# Patient Record
Sex: Female | Born: 1965 | Race: Black or African American | Hispanic: No | Marital: Married | Smoking: Never smoker
Health system: Southern US, Community
[De-identification: ages and names within clinical notes are randomized; demographics above are authoritative.]

## PROBLEM LIST (undated history)

## (undated) HISTORY — PX: VAGINAL HYSTERECTOMY: SHX2639

## (undated) HISTORY — PX: BILATERAL SALPINGOOPHORECTOMY: SHX1223

---

## 2003-10-07 ENCOUNTER — Emergency Department (HOSPITAL_COMMUNITY): Admission: EM | Admit: 2003-10-07 | Discharge: 2003-10-07 | Payer: Self-pay | Admitting: Emergency Medicine

## 2004-01-27 ENCOUNTER — Emergency Department: Payer: Self-pay | Admitting: Emergency Medicine

## 2004-06-25 ENCOUNTER — Emergency Department: Payer: Self-pay | Admitting: Emergency Medicine

## 2004-12-12 ENCOUNTER — Emergency Department: Payer: Self-pay | Admitting: Emergency Medicine

## 2004-12-12 ENCOUNTER — Ambulatory Visit: Payer: Self-pay | Admitting: Emergency Medicine

## 2005-04-30 ENCOUNTER — Emergency Department: Payer: Self-pay | Admitting: Emergency Medicine

## 2005-05-02 ENCOUNTER — Ambulatory Visit: Payer: Self-pay | Admitting: Gynecology

## 2005-05-02 ENCOUNTER — Inpatient Hospital Stay (HOSPITAL_COMMUNITY): Admission: AD | Admit: 2005-05-02 | Discharge: 2005-05-02 | Payer: Self-pay | Admitting: Gynecology

## 2005-05-25 ENCOUNTER — Ambulatory Visit (HOSPITAL_COMMUNITY): Admission: RE | Admit: 2005-05-25 | Discharge: 2005-05-25 | Payer: Self-pay | Admitting: Obstetrics & Gynecology

## 2005-07-09 ENCOUNTER — Encounter (INDEPENDENT_AMBULATORY_CARE_PROVIDER_SITE_OTHER): Payer: Self-pay | Admitting: Specialist

## 2005-07-09 ENCOUNTER — Ambulatory Visit: Payer: Self-pay | Admitting: Gynecology

## 2005-07-09 ENCOUNTER — Ambulatory Visit (HOSPITAL_COMMUNITY): Admission: RE | Admit: 2005-07-09 | Discharge: 2005-07-09 | Payer: Self-pay | Admitting: Gynecology

## 2005-08-06 ENCOUNTER — Ambulatory Visit: Payer: Self-pay | Admitting: Gynecology

## 2005-09-14 ENCOUNTER — Ambulatory Visit: Payer: Self-pay | Admitting: Gynecology

## 2005-10-24 ENCOUNTER — Ambulatory Visit: Payer: Self-pay | Admitting: Gynecology

## 2005-10-26 ENCOUNTER — Ambulatory Visit: Payer: Self-pay | Admitting: Gynecology

## 2006-02-21 ENCOUNTER — Ambulatory Visit: Payer: Self-pay | Admitting: Obstetrics & Gynecology

## 2006-03-01 ENCOUNTER — Ambulatory Visit: Payer: Self-pay | Admitting: Gynecology

## 2006-04-12 ENCOUNTER — Emergency Department: Payer: Self-pay | Admitting: Emergency Medicine

## 2006-04-16 ENCOUNTER — Ambulatory Visit: Payer: Self-pay | Admitting: Family Medicine

## 2006-05-29 ENCOUNTER — Ambulatory Visit: Payer: Self-pay | Admitting: Gynecology

## 2007-11-09 IMAGING — CT CT STONE STUDY
1 of 2 series · 16 of 32 positions shown, 20 images · non-contrast
Comparison: none

REASON FOR EXAM: RIGHT SIDED PAIN  RM 10
COMMENTS:

[Series 2: stone · axial · 0.64mm/px · z∈[-542,-152]mm · 16 of 145 slices shown, 20 images]
[im 10/145  soft-tissue]
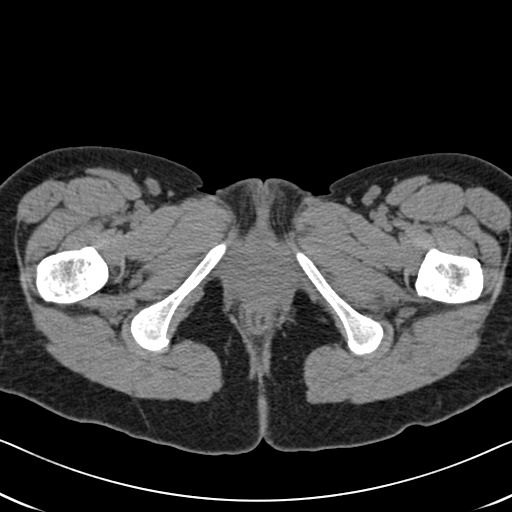
[im 10/145  bone]
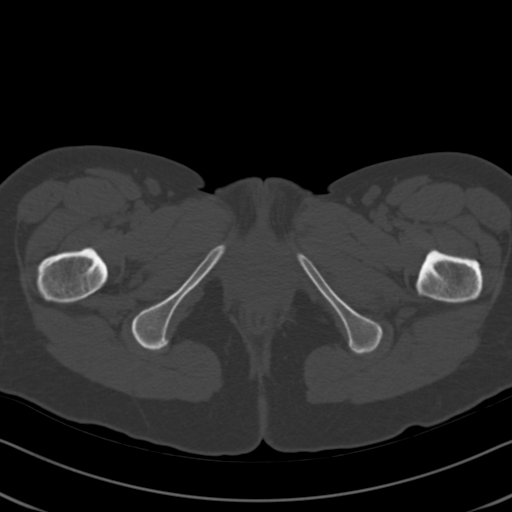
[im 20/145  soft-tissue]
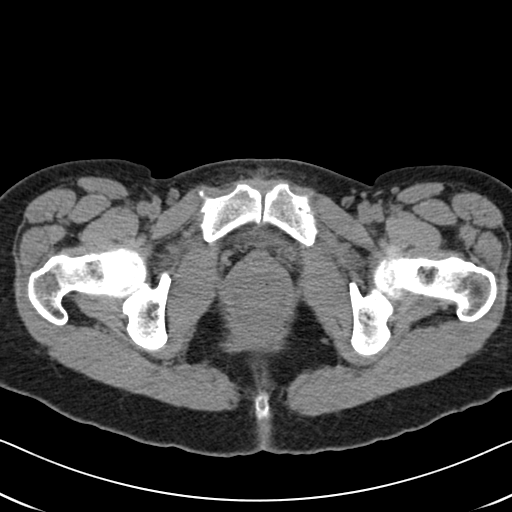
[im 30/145  soft-tissue]
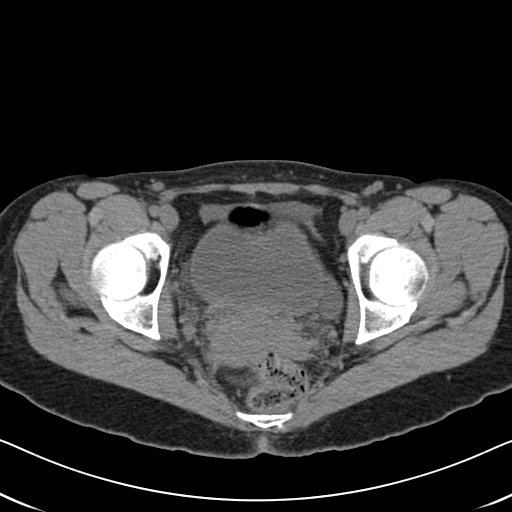
[im 40/145  soft-tissue]
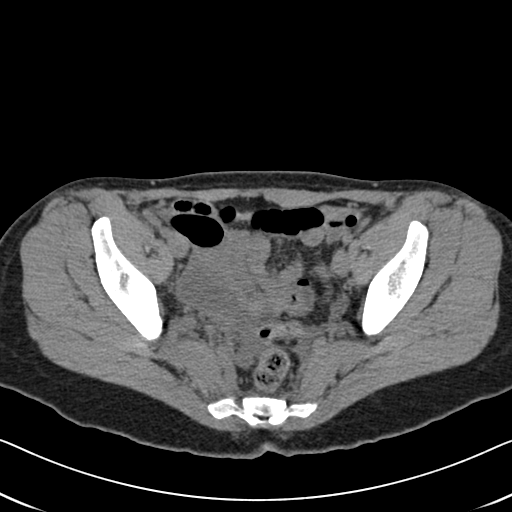
[im 50/145  soft-tissue]
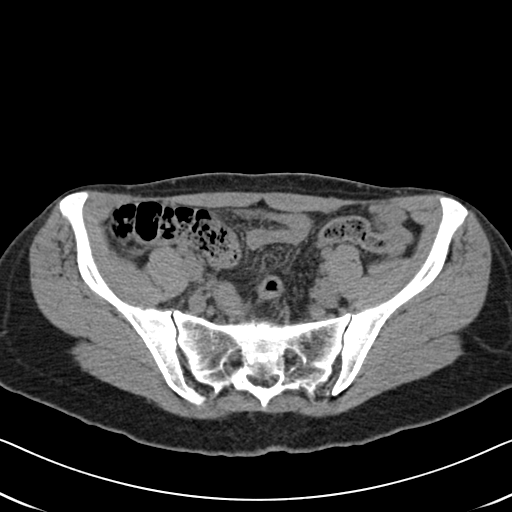
[im 60/145  soft-tissue]
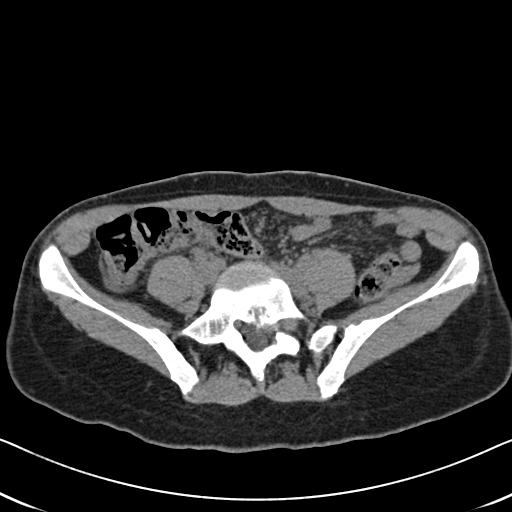
[im 70/145  soft-tissue]
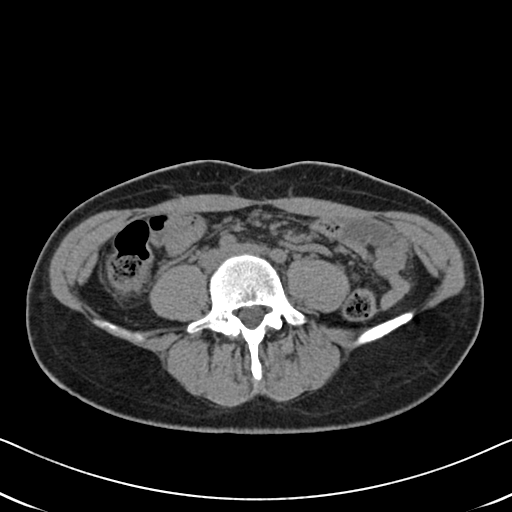
[im 80/145  soft-tissue]
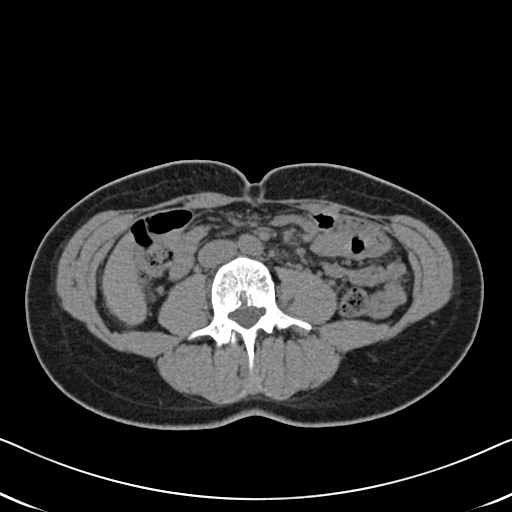
[im 90/145  soft-tissue]
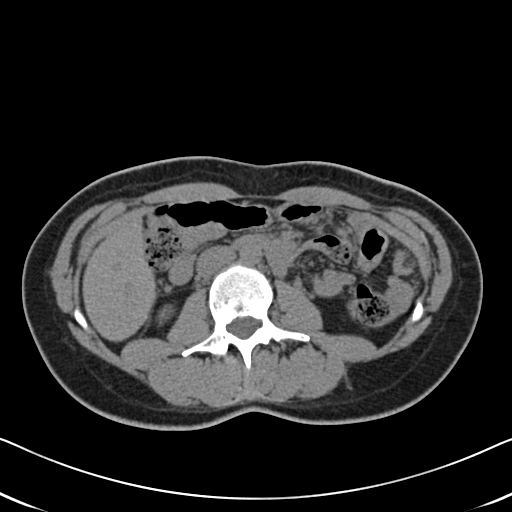
[im 90/145  bone]
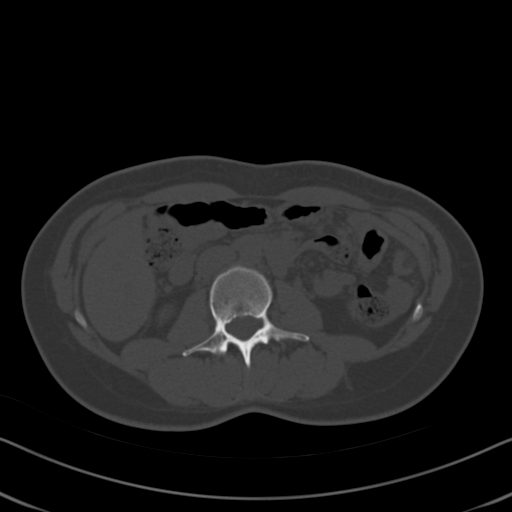
[im 100/145  soft-tissue]
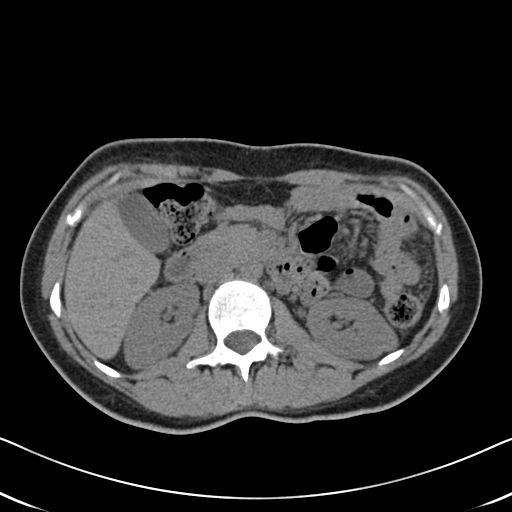
[im 110/145  soft-tissue]
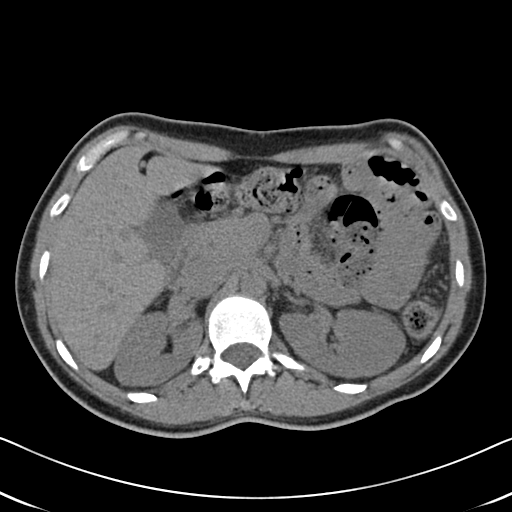
[im 120/145  soft-tissue]
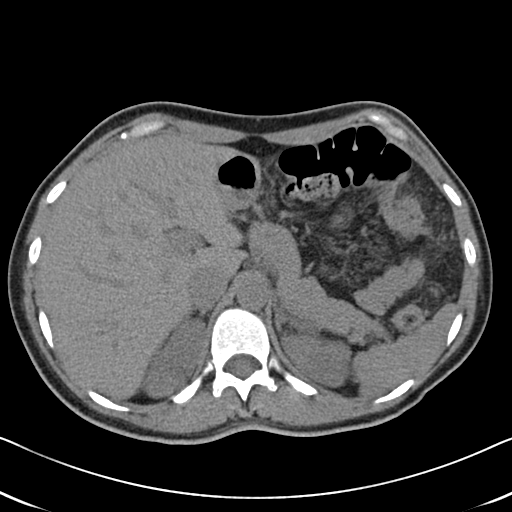
[im 125/145  lung]
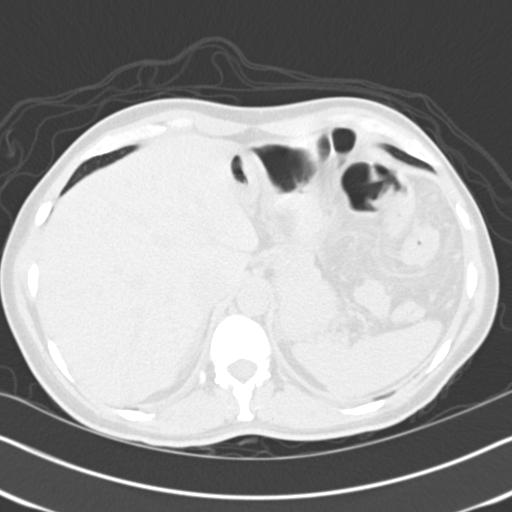
[im 130/145  soft-tissue]
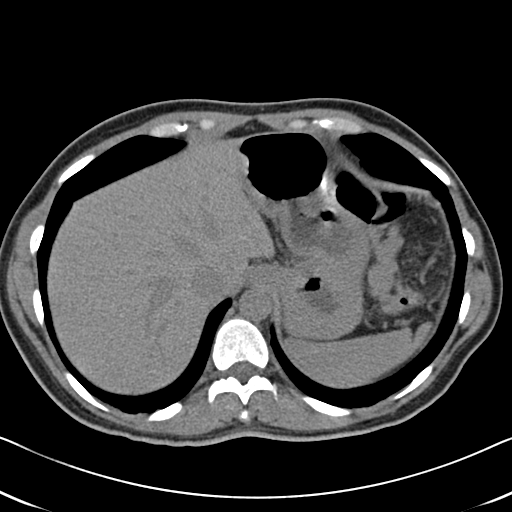
[im 130/145  lung]
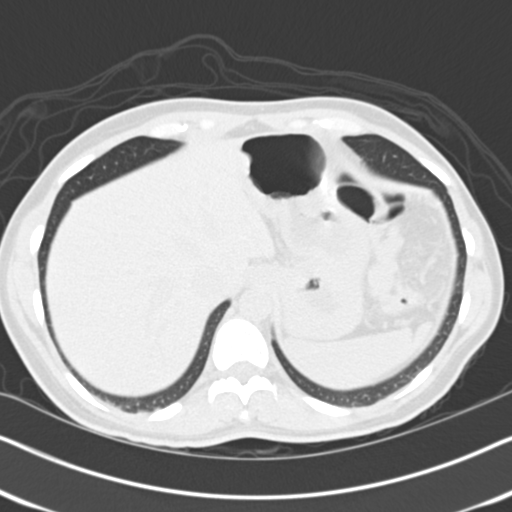
[im 135/145  lung]
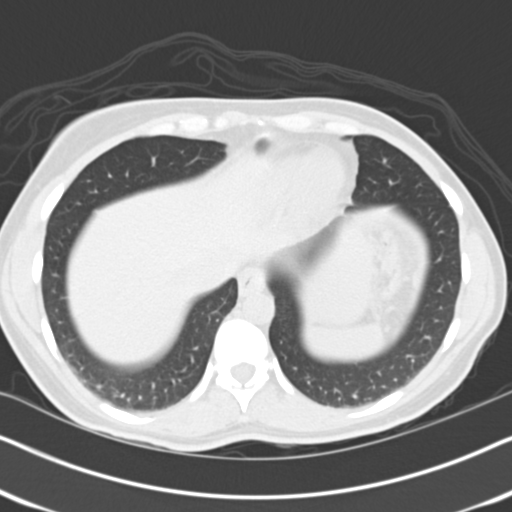
[im 140/145  soft-tissue]
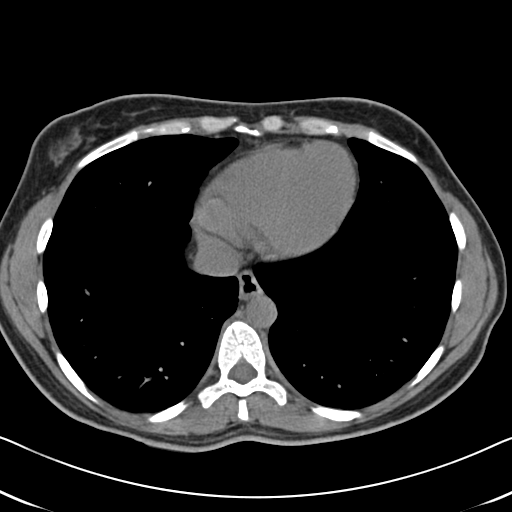
[im 140/145  lung]
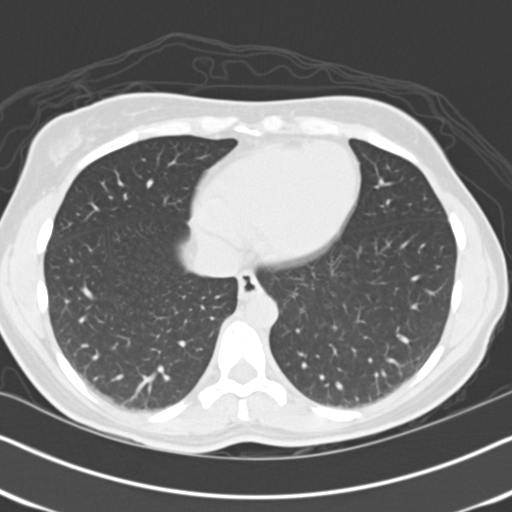

[16 of 32 positions shown; findings below may reference images not displayed]

PROCEDURE:     CT  - CT ABDOMEN /PELVIS WO (STONE)  - April 30, 2005  [DATE]

RESULT:

REASON FOR CONSULTATION: An emergent unenhanced CT scan was performed for
RIGHT sided flank pain.

No renal calculi are identified. No hydronephrosis. No ureteral calculi are
seen.

There is noted fluid in the cul-de-sac region. I cannot specifically
distinguish the ovaries.  There is a possible ovarian cyst on the RIGHT.  No
masses are noted. No definitive abnormality is seen in the major organs.
The images through the lung bases reveal the lung bases to be clear with no
effusions present.
IMPRESSION: 1)Fluid identified in the cul-de-sac.  Possible RIGHT ovarian cyst is
present although I am not certain.  This can be confirmed with ultrasound.
No renal or ureteral calculi are noted.

The report was called to the [HOSPITAL] the conclusion of the
dictation.

## 2007-11-09 IMAGING — US US PELV - US TRANSVAGINAL
1 series · 17 of 25 positions shown · non-contrast
Comparison: none

REASON FOR EXAM: Pelvic pain, fluid in pelvic area  RM 10
COMMENTS:  LMP: Post Hysterectomy

[Series 1: us pelv - us transvaginal · 17 of 47 slices shown]
[im 1/47]
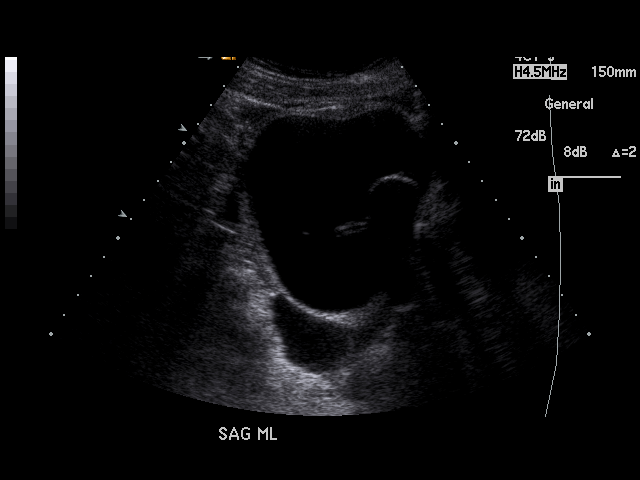
[im 4/47]
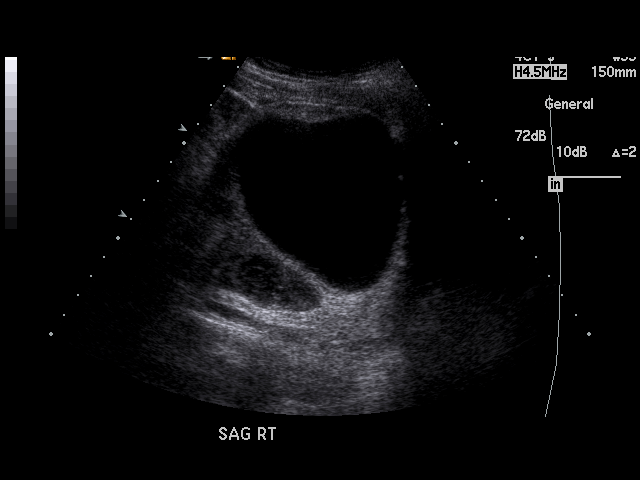
[im 6/47]
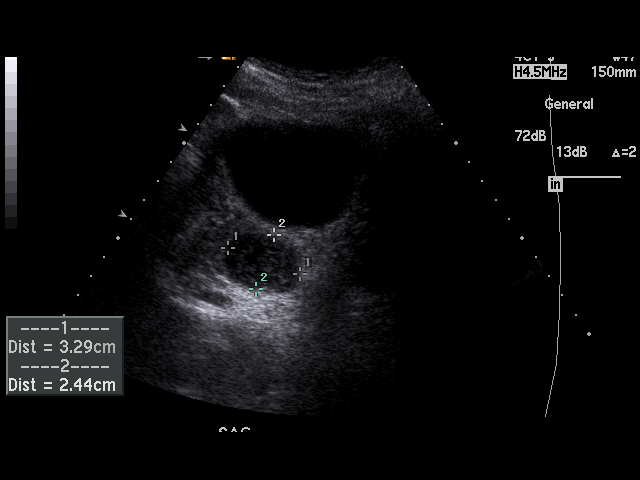
[im 10/47]
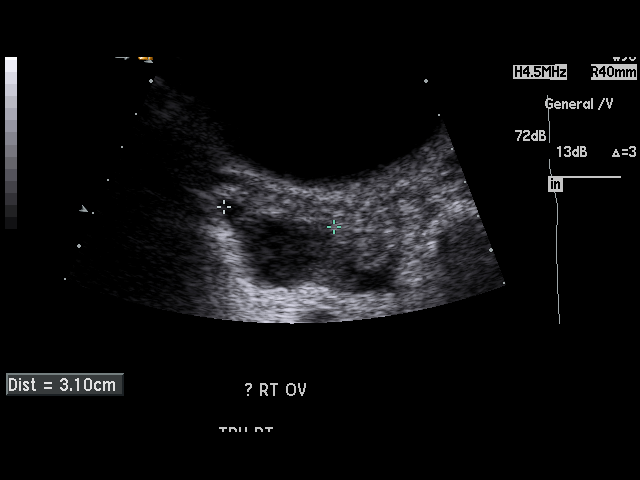
[im 12/47]
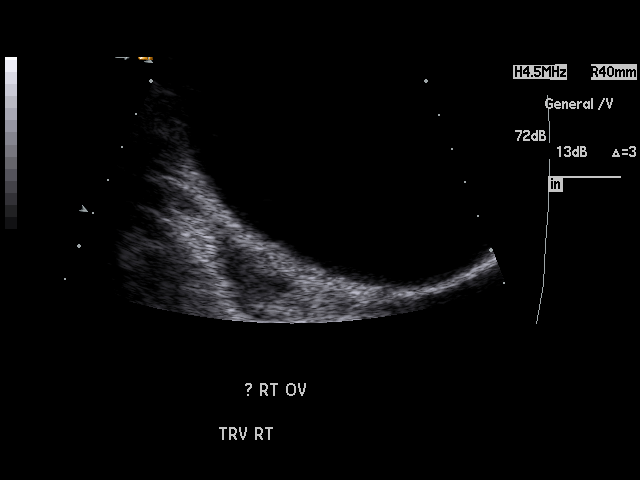
[im 16/47]
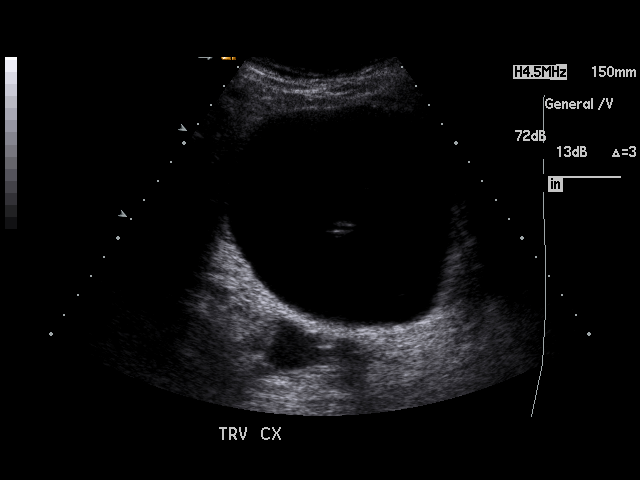
[im 18/47]
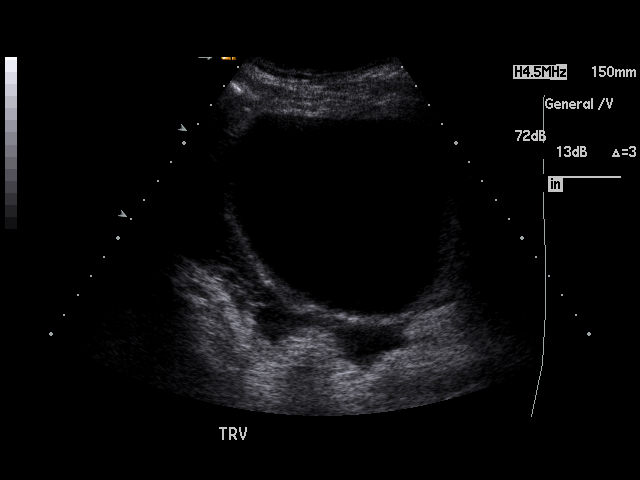
[im 22/47]
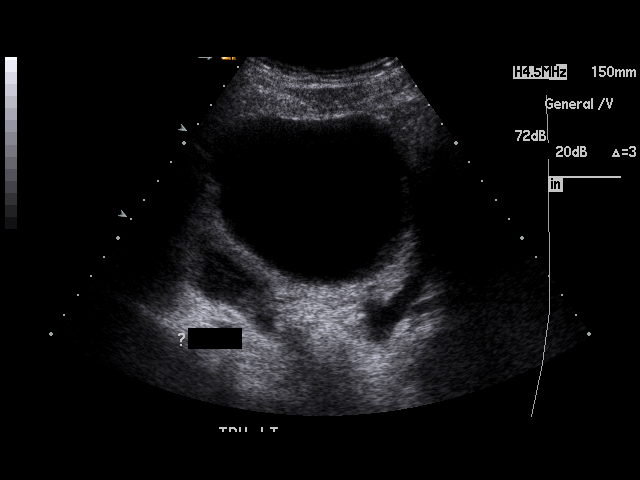
[im 24/47]
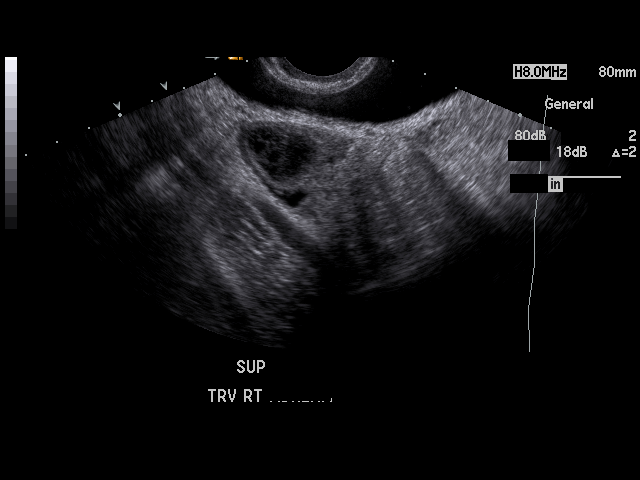
[im 25/47]
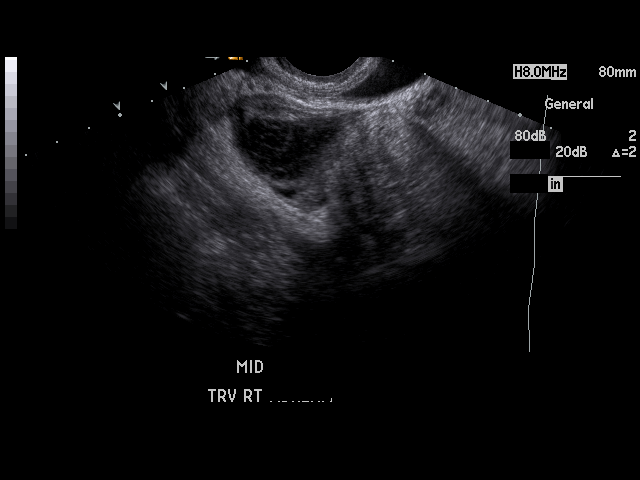
[im 29/47]
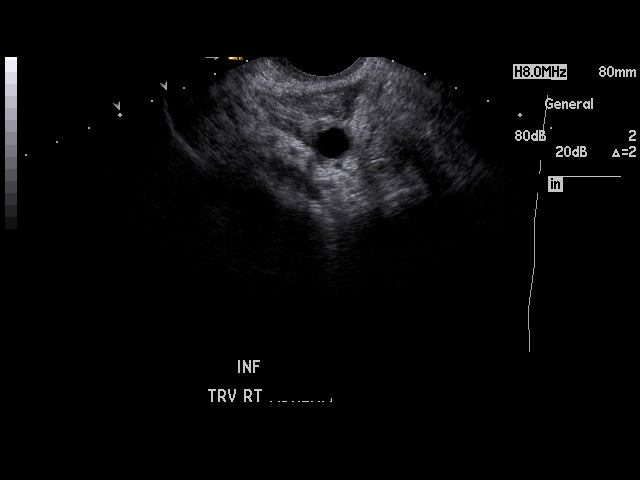
[im 31/47]
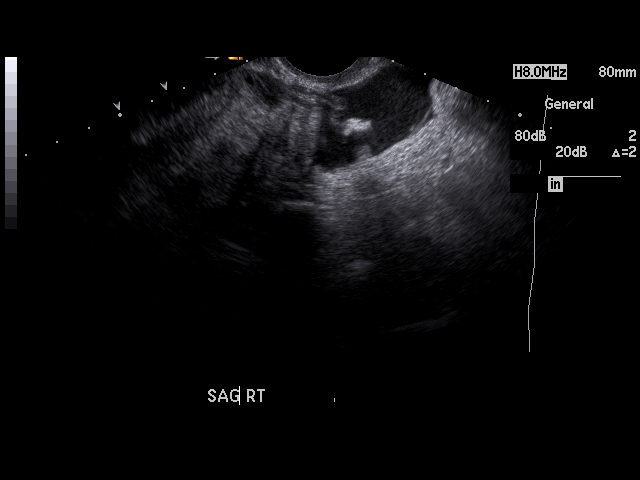
[im 35/47]
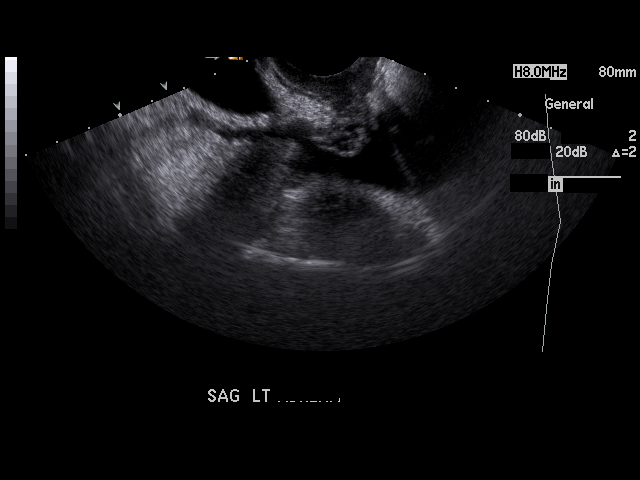
[im 37/47]
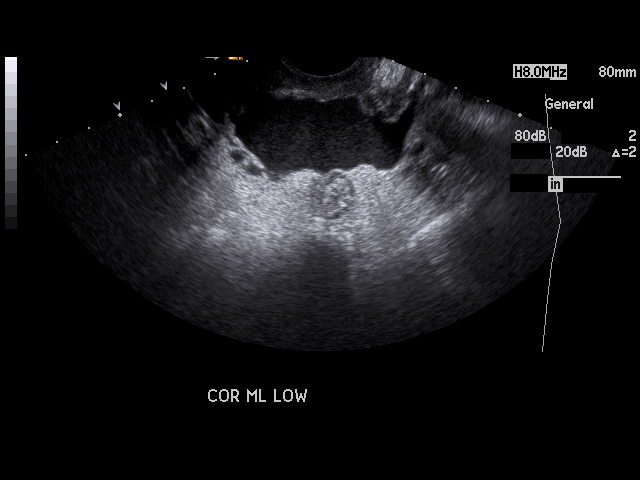
[im 41/47]
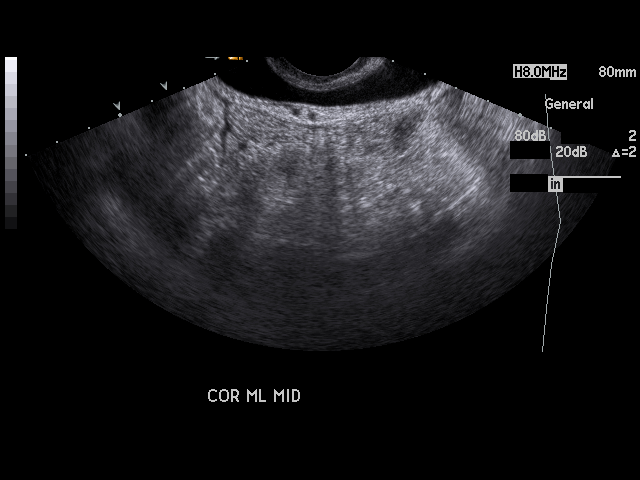
[im 43/47]
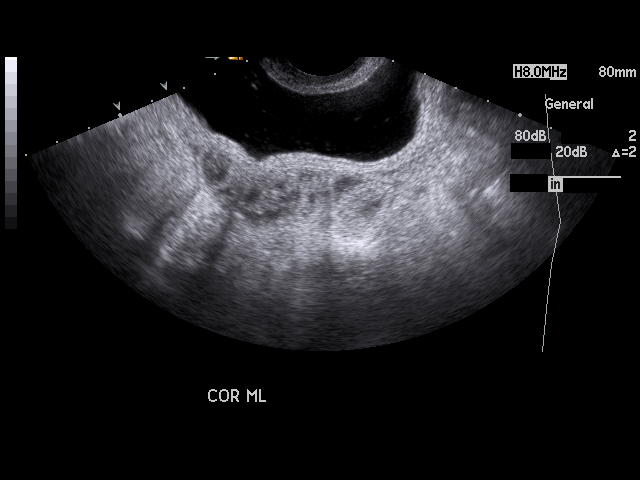
[im 47/47]
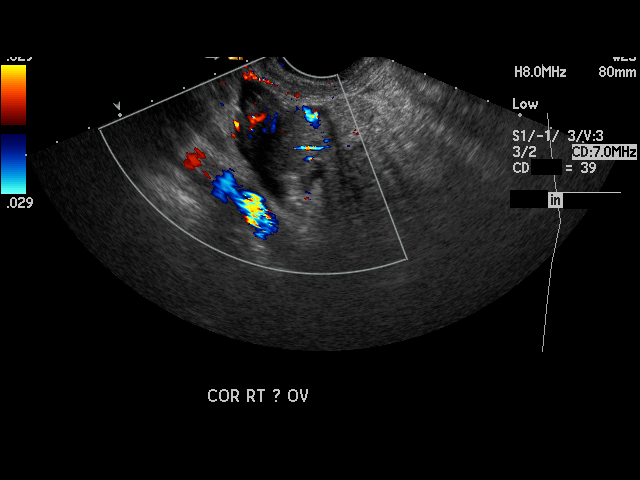

[17 of 25 positions shown; findings below may reference images not displayed]

PROCEDURE:     US  - US PELVIS MASS EXAM  - [DATE]  [DATE] [DATE] [DATE]

RESULT:       The patient is status post hysterectomy.  The LEFT ovary is
not seen compatible with LEFT oophorectomy.  There is a 3.29 cm complex cyst
in the RIGHT adnexal area.  Peripheral to the cyst there is what appears to
be ovarian tissue.  The findings most likely represent a cyst and an ovarian
remnant.  There is a moderate amount of free fluid in the pelvis.  The
etiology for the free fluid is not determined but this does appear to be
cellular fluid and could be heme from rupture of an ovarian cyst.  Follow-up
examination to document resolution and also to evaluate for regression or
progression of the complex RIGHT adnexal cyst is suggested.  The visualized
portion of the urinary bladder shows no significant abnormalities.
IMPRESSION: 1.     There is a 3.29 cm complex cyst in what appears to be a RIGHT ovarian
remnant.
2.     There is a moderate amount of free fluid in the pelvis.

## 2008-08-17 ENCOUNTER — Ambulatory Visit: Payer: Self-pay | Admitting: Obstetrics & Gynecology

## 2008-08-17 LAB — CONVERTED CEMR LAB: TSH: 1.089 microintl units/mL (ref 0.350–4.500)

## 2010-06-06 NOTE — Assessment & Plan Note (Signed)
[name], [name]                ACCOUNT NO.:  [id number]   MEDICAL RECORD NO.:  [id number]          PATIENT TYPE:  POB   LOCATION:  [facility]         FACILITY:  [facility]   PHYSICIAN:  Jaynie Collins, MD     DATE OF BIRTH:  January 07, 1966   DATE OF SERVICE:  08/17/2008                                  CLINIC NOTE   CHIEF COMPLAINT:  Annual examination.   HISTORY OF PRESENT ILLNESS:  The patient is a 45 year old gravida 1,  para 1 status post an LAVH-BSO in 2004 for chronic pelvic pain and  pelvic inflammatory disease.  The patient did have a followup  laparoscopic right salpingo-oophorectomy as that was a remnant of the  right ovary that was still causing her pain after her hysterectomy.  Since then, the patient denies being in any pain.  She did not have any  history of cervical dysplasia prior to her hysterectomy and does not  need annual Pap smear screening.  She denies any gynecologic concerns  currently.   PAST OBSTETRICS AND GYNECOLOGIC HISTORY:  One spontaneous vaginal  delivery 25 years ago.  The patient had a history of chronic pelvic pain  status post a hysterectomy and bilateral salpingo-oophorectomy.  No  history of any sexually transmitted infections.  No cervical dysplasia  in the past.  The patient had a mammogram on June 29, 2008, that was  negative.   PAST MEDICAL HISTORY:  Diverticulitis, which has not been active for a  long time.   PAST SURGICAL HISTORY:  Diagnostic laparoscopy in 2003 and LAVH-BSO in  2004 and a laparoscopic RSO in 2006.   MEDICATIONS:  1. Calcium.  2. Centrum.  3. Folic acid.  4. Vivelle patch 0.5 and 0.325, which are alternating.  5. Vitamin C.  6. Aspirin daily.   ALLERGIES:  COMPAZINE and TIGAN.   SOCIAL HISTORY:  The patient works as a Engineer, mining.  She lives in  [place], but comes down here to [place] a lot, and she has been  the patient of this office for several years.  Her sister currently  lives with her and  also her grandmother.  She is not sexually active  right now.  She reports social alcohol use, but no tobacco or illicit  drug use.   FAMILY HISTORY:  Unremarkable for her father having liver cancer.  No  gynecologic, breast or colon cancer history.   REVIEW OF SYSTEMS:  A 14-point comprehensive review of systems was  discussed and was entirely negative.   PHYSICAL EXAMINATION:  VITAL SIGNS:  Blood pressure is 133/71, pulse 95,  weight 137 pounds, height 5 feet 5 inches.  GENERAL:  No apparent distress.  HEENT:  Normocephalic, atraumatic.  NECK:  Supple.  No masses.  BREASTS:  Symmetric in size.  No abnormal masses, skin changes, nipple  drainage or lymphadenopathy, nontender on examination.  ABDOMEN:  Soft, nontender, nondistended.  A well-healed laparoscopic  incision scars.  EXTREMITIES:  No cyanosis, clubbing or edema, nontender.  PELVIC:  Normal external female genitalia.  Pink, well-rugated vagina.  Well healed vaginal cuff, abnormal lesions or discharge noted.  Bimanual  exam was done and was  entirely unremarkable.  No masses were palpated  consistent with a surgically absent uterus and bilateral adnexa.   ASSESSMENT AND PLAN:  The patient is a 45 year old gravida 1, para 1  here for annual exam.  The patient had a normal breast examination  today.  She also has a scheduled mammogram on August 18, 2008, and we will  follow up with the results.  There was no indication for Pap smear  today.  She had a normal pelvic examination.  The patient is interested  in getting tested for sexually transmitted infections as part of her  annual and physical examination, so she will have serum testing for HIV,  syphilis, and hepatitis B and C.  She was told to call or come back in  for any further gynecologic concerns.            ______________________________  Jaynie Collins, MD     UA/MEDQ  D:  08/17/2008  T:  08/18/2008  Job:  [id number]

## 2010-06-09 NOTE — Op Note (Signed)
[name], [name]                ACCOUNT NO.:  [id number]   MEDICAL RECORD NO.:  [id number]          PATIENT TYPE:  AMB   LOCATION:  [facility]                           FACILITY:  [facility]   PHYSICIAN:  Ginger Carne, MD  DATE OF BIRTH:  September 12, 1965   DATE OF PROCEDURE:  07/09/2005  DATE OF DISCHARGE:                                 OPERATIVE REPORT   PREOPERATIVE DIAGNOSIS:  Right chronic lower quadrant pain.   POSTOPERATIVE DIAGNOSIS:  Right chronic lower quadrant pain, right retained  ovarian syndrome.   OPERATIVE PROCEDURE:  Laparoscopic right salpingo-oophorectomy.   SURGEON:  Ginger Carne, MD   ASSISTANT:  Dr. Okey Dupre.   ESTIMATED BLOOD LOSS:  Minimal.   SPECIMEN:  Right tube and ovary.   ANESTHESIA:  General.   COMPLICATIONS:  None immediate.   OPERATIVE FINDINGS:  The patient had previously excised uterus, cervix, left  tube and ovary and a right salpingo-oophorectomy and appendectomy in the  past.  It was the right adnexa remnant ovarian syndrome which was  demonstrable by ovarian tissue along the pelvic side wall including two  portions of two.  The ureter was involved in this process and had to be  carefully tweezed and dissected from said remnant ovary for safe removal of  adnexal structures.   OPERATIVE PROCEDURE:  The patient prepped and draped in usual fashion and  placed in the lithotomy position.  Betadine solution used for antiseptic and  the patient was catheterized prior to procedure.  After adequate general  anesthesia, vertical infraumbilical incision was made.  Veress needle placed  in the abdomen.  Opening closing pressures were 10-15 mmHg.  Needle  released, trocar placed through same incision.  Laparoscope placed in trocar  sleeve.  Afterwards two 5 mm ports were made in left lower quadrant, left  hypogastric regions.  After inspecting the pelvic and abdominal contents,  the ureter was identified along the right pelvic sidewall.  This was  carefully  dissected off the right remnant ovarian structure.  Using the  careful sharp and blunt dissection, the remnant right ovary and portion of  right tube were carefully removed.  Bleeding points meticulously  hemostatically checked.  Copious irrigation with lactated Ringer's followed.  At the end of procedure, no remaining ovary could be identified, irrigant  removed.  Gas released, trocars removed.  Closure of the 10 mm fascia site  with 0 Vicryl suture and 4-0 Vicryl for subcuticular closure.  Instrument  and sponge count were correct.  The patient tolerated procedure well,  returned post anesthesia recovery room in excellent condition.  July 09, 2005      Ginger Carne, MD  Electronically Signed     SHB/MEDQ  D:  07/09/2005  T:  07/10/2005  Job:  [id number]

## 2010-06-09 NOTE — H&P (Signed)
[name], [name]                ACCOUNT NO.:  [id number]   MEDICAL RECORD NO.:  [id number]          PATIENT TYPE:  AMB   LOCATION:  [facility]                           [facility]ACILITY:  [facility]   PHYSICIAN:  Ginger Carne, MD  DATE OF BIRTH:  1965-05-10   DATE OF ADMISSION:  DATE OF DISCHARGE:                                HISTORY & PHYSICAL   REASON FOR HOSPITALIZATION:  Chronic right lower quadrant pain.   IN-HOSPITAL PROCEDURES:  Laparoscopic right salpingo-oophorectomy, possible  laparotomy.   HISTORY OF PRESENT ILLNESS:  This patient is a 45 year old gravida 3, para 1-  1-1-2, African-American female admitted because of chronic right lower  quadrant pain over 12 months.  The patient had a laparoscopic-assisted  vaginal hysterectomy and bilateral salpingo-oophorectomy with an  appendectomy in January 2004 because of chronic pelvic inflammatory disease.  She had done well until the past 12 months, at which time she began  experiencing chronic right lower quadrant pain.  Ultrasound performed  serially throughout this time has demonstrated a persistent 3-5 cm complex  cystic mass in the right adnexa.  CT scan revealed no evidence for renal  calculi, hydronephrosis or ureteral calculi.  No other findings noted on CT  scan.  The patient has no genitourinary or gastrointestinal symptoms  compatible with said pain.  No musculoskeletal sources for said pain.   OB/GYN HISTORY:  The patient has had three pregnancies, two vaginal  deliveries and one miscarriage in a first trimester and a laparoscopic-  assisted vaginal hysterectomy bilateral salpingo-oophorectomy and  appendectomy in January 2004.  History of chronic pelvic inflammatory  disease.   ALLERGIES:  TIGAN.   CURRENT MEDICATIONS:  Darvocet-N 100 taken as needed for pain and Vivelle  Dot as needed   MEDICAL HISTORY:  Chronic pelvic inflammatory disease.   SURGICAL HISTORY:  Per above.   SOCIAL HISTORY:  The patient is a  nonsmoker.  Denies alcohol or illicit drug  abuse.   FAMILY HISTORY:  Father has hypertension.   REVIEW OF SYSTEMS:  A 10-point comprehensive review of systems within normal  limits.   PHYSICAL EXAMINATION:  GENERAL:  Pleasant female in no acute distress.  VITAL SIGNS:  Height 5 feet 5 inches, blood pressure 110/68, weight 124  pounds.  HEENT:  Grossly normal.  BREASTS:  Without mass, discharge, thickenings or tenderness.  CHEST:  Clear to percussion and auscultation.  CARDIOVASCULAR:  Without  murmurs or enlargements.  Regular rate and rhythm.  EXTREMITY, LYMPHATIC, SKIN, NEUROLOGIC, MUSCULOSKELETAL:  Within normal  limits.  ABDOMEN:  Soft without gross hepatosplenomegaly.  No CVA tenderness  bilaterally.  PELVIC:  External genitalia, vulva and vagina normal.  Cervix and uterus  absent.  Tenderness and right adnexa with slight fullness.  Left adnexa  negative.  RECTAL:  Hemoccult-negative without masses.   Urinalysis within normal limits.   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   PLAN:  The patient will undergo a laparoscopic right salpingo-oophorectomy  and/or removal of said structure with possible laparotomy and possible  placement of ureteral catheters.  Ashby Dawes of said procedure discussed in  detail, including possible injuries to ureter, bowel and bladder.  All  questions were answered to the satisfaction of said patient.  The patient  verbalized understanding of the nature of said procedure.      Ginger Carne, MD  Electronically Signed     SHB/MEDQ  D:  07/05/2005  T:  07/05/2005  Job:  [id number]

## 2010-09-11 ENCOUNTER — Telehealth: Payer: Self-pay | Admitting: *Deleted

## 2010-09-11 DIAGNOSIS — B009 Herpesviral infection, unspecified: Secondary | ICD-10-CM

## 2010-09-11 MED ORDER — VALACYCLOVIR HCL 1 G PO TABS: 1000.0000 mg | ORAL_TABLET | Freq: Every day | ORAL | Status: AC

## 2010-09-11 NOTE — Telephone Encounter (Signed)
Needs refill of Valtrex maintenance.

## 2010-11-08 ENCOUNTER — Telehealth: Payer: Self-pay | Admitting: *Deleted

## 2010-11-08 DIAGNOSIS — Z9189 Other specified personal risk factors, not elsewhere classified: Secondary | ICD-10-CM

## 2010-11-08 DIAGNOSIS — N951 Menopausal and female climacteric states: Secondary | ICD-10-CM

## 2010-11-08 MED ORDER — ESTRADIOL 0.05 MG/24HR TD PTTW: 1.0000 | MEDICATED_PATCH | TRANSDERMAL | Status: DC

## 2010-11-08 MED ORDER — FLUCONAZOLE 150 MG PO TABS: 150.0000 mg | ORAL_TABLET | Freq: Once | ORAL | Status: AC

## 2010-11-08 NOTE — Telephone Encounter (Signed)
Patient needs refill on her vivelle dot to her pharmacy in [place]. She also needs a rx for Diflucan for occasional yeast infections that she gets.

## 2011-03-13 ENCOUNTER — Telehealth: Payer: Self-pay | Admitting: *Deleted

## 2011-03-13 DIAGNOSIS — A609 Anogenital herpesviral infection, unspecified: Secondary | ICD-10-CM

## 2011-03-13 DIAGNOSIS — N390 Urinary tract infection, site not specified: Secondary | ICD-10-CM

## 2011-03-13 MED ORDER — VALACYCLOVIR HCL 1 G PO TABS: 1000.0000 mg | ORAL_TABLET | Freq: Every day | ORAL | Status: AC

## 2011-03-13 MED ORDER — CIPROFLOXACIN HCL 500 MG PO TABS: 500.0000 mg | ORAL_TABLET | Freq: Two times a day (BID) | ORAL | Status: AC

## 2011-03-13 NOTE — Telephone Encounter (Signed)
Patient is out of town and is having pain with urination low back discomfort and frequency.  We will call in cipro for her and she will let us know once she returns if her symptoms persist.

## 2011-09-13 ENCOUNTER — Other Ambulatory Visit: Payer: Self-pay | Admitting: *Deleted

## 2011-09-14 ENCOUNTER — Telehealth: Payer: Self-pay | Admitting: *Deleted

## 2011-09-14 MED ORDER — BUSPIRONE HCL 15 MG PO TABS: 15.0000 mg | ORAL_TABLET | Freq: Three times a day (TID) | ORAL | Status: AC

## 2011-09-14 NOTE — Telephone Encounter (Signed)
Patients mother was admitted to the hospital for dehydration and during some scans they have found a tumor and are almost positive that it is cancer.  Patient is on her way there to help her and would like something called in for anxiety so she can stay calm but not be knocked out.  She will keep [place] updated on her mother's condition.

## 2011-09-14 NOTE — Telephone Encounter (Signed)
Medication had to be sent to a different pharmacy.

## 2011-12-22 ENCOUNTER — Other Ambulatory Visit: Payer: Self-pay | Admitting: Family Medicine

## 2012-03-03 ENCOUNTER — Telehealth: Payer: Self-pay | Admitting: *Deleted

## 2012-03-03 DIAGNOSIS — B009 Herpesviral infection, unspecified: Secondary | ICD-10-CM

## 2012-03-03 MED ORDER — VALACYCLOVIR HCL 1 G PO TABS: 1000.0000 mg | ORAL_TABLET | Freq: Two times a day (BID) | ORAL | Status: DC

## 2012-03-03 NOTE — Telephone Encounter (Signed)
Patient needs refill of Valtrex.

## 2012-04-03 ENCOUNTER — Other Ambulatory Visit: Payer: Self-pay | Admitting: Family Medicine

## 2012-12-17 ENCOUNTER — Telehealth: Payer: Self-pay | Admitting: *Deleted

## 2012-12-17 DIAGNOSIS — N390 Urinary tract infection, site not specified: Secondary | ICD-10-CM

## 2012-12-17 MED ORDER — CIPROFLOXACIN HCL 500 MG PO TABS: 500.0000 mg | ORAL_TABLET | Freq: Two times a day (BID) | ORAL | Status: DC

## 2012-12-17 NOTE — Telephone Encounter (Signed)
Patient has symptoms of uti and is in [place], she would like to have something called in for her.

## 2013-01-21 ENCOUNTER — Telehealth: Payer: Self-pay

## 2013-01-21 NOTE — Telephone Encounter (Signed)
Patient called nurse needing a letter sent for employment purposes. Per Inetta Fermo I typed up a letter based on another letter nurse had drafted for a previous patient. I faxed it to [place] per patients request to [phone].

## 2013-02-18 ENCOUNTER — Other Ambulatory Visit: Payer: Self-pay | Admitting: Family Medicine

## 2013-06-11 ENCOUNTER — Telehealth: Payer: Self-pay | Admitting: *Deleted

## 2013-06-11 DIAGNOSIS — N390 Urinary tract infection, site not specified: Secondary | ICD-10-CM

## 2013-06-11 MED ORDER — CIPROFLOXACIN HCL 500 MG PO TABS: 500.0000 mg | ORAL_TABLET | Freq: Two times a day (BID) | ORAL | Status: DC

## 2013-06-11 NOTE — Telephone Encounter (Signed)
Patient is out of town and is having increased pain and frequency with urination follow intercourse over the weekend.  She will be back in town in June to follow up for her yearly exam.

## 2013-11-10 ENCOUNTER — Telehealth: Payer: Self-pay | Admitting: *Deleted

## 2013-11-10 MED ORDER — ESTROGENS CONJ SYNTHETIC A 0.45 MG PO TABS: 0.4500 mg | ORAL_TABLET | Freq: Every day | ORAL | Status: DC

## 2013-11-10 NOTE — Telephone Encounter (Signed)
Patient would like an estrogen tablet called in in place of the patches as they are not covered under her insurance and cost too much.  She has tried the oral estrogen several years ago and just preferred the patches but wants to go back to the oral due to cost.

## 2014-02-19 ENCOUNTER — Telehealth: Payer: Self-pay | Admitting: *Deleted

## 2014-02-19 MED ORDER — METOPROLOL SUCCINATE ER 50 MG PO TB24: 50.0000 mg | ORAL_TABLET | Freq: Every day | ORAL | Status: DC

## 2014-02-19 NOTE — Telephone Encounter (Signed)
Patient is out of town and needs a refill of her BP medication called in until she can make her follow up appointment.  One month refill given to patient.

## 2014-03-04 ENCOUNTER — Other Ambulatory Visit: Payer: Self-pay | Admitting: Family Medicine

## 2014-03-15 ENCOUNTER — Other Ambulatory Visit: Payer: Self-pay | Admitting: Family Medicine

## 2014-04-29 ENCOUNTER — Telehealth: Payer: Self-pay | Admitting: *Deleted

## 2014-04-29 DIAGNOSIS — N390 Urinary tract infection, site not specified: Secondary | ICD-10-CM

## 2014-04-29 MED ORDER — ESTRADIOL 0.05 MG/24HR TD PTTW: MEDICATED_PATCH | TRANSDERMAL | Status: DC

## 2014-04-29 MED ORDER — CIPROFLOXACIN HCL 500 MG PO TABS: 500.0000 mg | ORAL_TABLET | Freq: Two times a day (BID) | ORAL | Status: DC

## 2014-04-29 MED ORDER — IBUPROFEN 800 MG PO TABS: 800.0000 mg | ORAL_TABLET | Freq: Three times a day (TID) | ORAL | Status: AC | PRN

## 2014-04-29 NOTE — Telephone Encounter (Signed)
Patient is requesting refill of Cipro for UTI and URI that she has had for over a week.  She also needs a refill of her vivelle dot patches and Ibuprofen.

## 2014-06-10 ENCOUNTER — Telehealth: Payer: Self-pay | Admitting: *Deleted

## 2014-06-10 ENCOUNTER — Other Ambulatory Visit: Payer: Self-pay | Admitting: *Deleted

## 2014-06-10 DIAGNOSIS — A609 Anogenital herpesviral infection, unspecified: Secondary | ICD-10-CM

## 2014-06-10 MED ORDER — VALACYCLOVIR HCL 1 G PO TABS: 1000.0000 mg | ORAL_TABLET | Freq: Every day | ORAL | Status: DC

## 2014-06-10 NOTE — Telephone Encounter (Signed)
Sent Rx to the wrong pharmacy, corrected and sent to correct pharmacy.

## 2014-06-10 NOTE — Telephone Encounter (Signed)
Patient is needing refill of Valtrex.

## 2014-06-11 ENCOUNTER — Other Ambulatory Visit: Payer: Self-pay | Admitting: Family Medicine

## 2014-08-27 ENCOUNTER — Other Ambulatory Visit: Payer: Self-pay | Admitting: Family Medicine

## 2014-09-25 ENCOUNTER — Other Ambulatory Visit: Payer: Self-pay | Admitting: Family Medicine

## 2014-10-29 ENCOUNTER — Encounter: Payer: Self-pay | Admitting: Family Medicine

## 2014-10-29 ENCOUNTER — Ambulatory Visit (INDEPENDENT_AMBULATORY_CARE_PROVIDER_SITE_OTHER): Payer: Federal, State, Local not specified - PPO | Admitting: Family Medicine

## 2014-10-29 VITALS — BP 133/88 | HR 60 | Ht 65.0 in | Wt 149.0 lb

## 2014-10-29 DIAGNOSIS — Z90710 Acquired absence of both cervix and uterus: Secondary | ICD-10-CM | POA: Diagnosis not present

## 2014-10-29 DIAGNOSIS — Z01419 Encounter for gynecological examination (general) (routine) without abnormal findings: Secondary | ICD-10-CM

## 2014-10-29 DIAGNOSIS — N958 Other specified menopausal and perimenopausal disorders: Secondary | ICD-10-CM | POA: Diagnosis not present

## 2014-10-29 DIAGNOSIS — E8940 Asymptomatic postprocedural ovarian failure: Secondary | ICD-10-CM

## 2014-10-29 LAB — COMPREHENSIVE METABOLIC PANEL
ALBUMIN: 4.2 g/dL (ref 3.6–5.1)
ALK PHOS: 71 U/L (ref 33–115)
ALT: 20 U/L (ref 6–29)
AST: 26 U/L (ref 10–35)
BILIRUBIN TOTAL: 0.6 mg/dL (ref 0.2–1.2)
BUN: 12 mg/dL (ref 7–25)
CO2: 28 mmol/L (ref 20–31)
CREATININE: 0.79 mg/dL (ref 0.50–1.10)
Calcium: 9 mg/dL (ref 8.6–10.2)
Chloride: 103 mmol/L (ref 98–110)
Glucose, Bld: 89 mg/dL (ref 65–99)
Potassium: 3.9 mmol/L (ref 3.5–5.3)
SODIUM: 140 mmol/L (ref 135–146)
TOTAL PROTEIN: 7 g/dL (ref 6.1–8.1)

## 2014-10-29 LAB — LIPID PANEL
CHOL/HDL RATIO: 3.7 ratio (ref <=5.0)
CHOLESTEROL: 226 mg/dL — AB (ref 125–200)
HDL: 61 mg/dL (ref >=46)
LDL Cholesterol: 148 mg/dL — ABNORMAL HIGH (ref <130)
Triglycerides: 84 mg/dL (ref <150)
VLDL: 17 mg/dL (ref <30)

## 2014-10-29 LAB — CBC
HCT: 40.7 % (ref 36.0–46.0)
HEMOGLOBIN: 14.1 g/dL (ref 12.0–15.0)
MCH: 32 pg (ref 26.0–34.0)
MCHC: 34.6 g/dL (ref 30.0–36.0)
MCV: 92.5 fL (ref 78.0–100.0)
MPV: 9 fL (ref 8.6–12.4)
PLATELETS: 419 10*3/uL — AB (ref 150–400)
RBC: 4.4 MIL/uL (ref 3.87–5.11)
RDW: 13.5 % (ref 11.5–15.5)
WBC: 7.2 10*3/uL (ref 4.0–10.5)

## 2014-10-29 NOTE — Progress Notes (Signed)
  Subjective:     [name] is a 49 y.o. female and is here for a comprehensive physical exam. The patient reports no problems. She is s/p hysterectomy and bso. On Vivelle dot.  Lives in [place].  Social History   Social History  . Marital Status: Married    Spouse Name: N/A  . Number of Children: N/A  . Years of Education: N/A   Occupational History  . Not on file.   Social History Main Topics  . Smoking status: Never Smoker   . Smokeless tobacco: Never Used  . Alcohol Use: 0.0 oz/week    0 Standard drinks or equivalent per week  . Drug Use: No  . Sexual Activity: Not Currently   Other Topics Concern  . Not on file   Social History Narrative  . No narrative on file   Health Maintenance  Topic Date Due  . TETANUS/TDAP  11/22/1984  . PAP SMEAR  11/23/1986  . INFLUENZA VACCINE  08/23/2014  . HIV Screening  Completed    The following portions of the patient's history were reviewed and updated as appropriate: allergies, current medications, past family history, past medical history, past social history, past surgical history and problem list.  Review of Systems A comprehensive review of systems was negative.   Objective:    BP 133/88 mmHg  Pulse 60  Ht  (1.651 m)  Wt 149 lb (67.586 kg)  BMI 24.79 kg/m2 General appearance: alert, cooperative and appears stated age Head: Normocephalic, without obvious abnormality, atraumatic Neck: no adenopathy, supple, symmetrical, trachea midline and thyroid not enlarged, symmetric, no tenderness/mass/nodules Lungs: clear to auscultation bilaterally Breasts: normal appearance, no masses or tenderness Heart: regular rate and rhythm, S1, S2 normal, no murmur, click, rub or gallop Abdomen: soft, non-tender; bowel sounds normal; no masses,  no organomegaly Pelvic: external genitalia normal, no adnexal masses or tenderness, uterus surgically absent and vagina normal without discharge Extremities: extremities normal, atraumatic,  no cyanosis or edema Pulses: 2+ and symmetric Skin: Skin color, texture, turgor normal. No rashes or lesions Lymph nodes: Cervical, supraclavicular, and axillary nodes normal. Neurologic: Grossly normal    Assessment:    Healthy female exam.      Plan:      Problem List Items Addressed This Visit      Unprioritized   Surgical menopause    Other Visit Diagnoses    Encounter for routine gynecological examination    -  Primary    Relevant Orders    CBC    Comprehensive metabolic panel    TSH    Lipid panel      Continue Vivelle dot for estrogen Mammograms done in [place] Declines flu  See After Visit Summary for Counseling Recommendations

## 2014-10-29 NOTE — Patient Instructions (Signed)
Preventive Care for Adults, Female A healthy lifestyle and preventive care can promote health and wellness. Preventive health guidelines for women include the following key practices.  A routine yearly physical is a good way to check with your health care provider about your health and preventive screening. It is a chance to share any concerns and updates on your health and to receive a thorough exam.  Visit your dentist for a routine exam and preventive care every 6 months. Brush your teeth twice a day and floss once a day. Good oral hygiene prevents tooth decay and gum disease.  The frequency of eye exams is based on your age, health, family medical history, use of contact lenses, and other factors. Follow your health care provider's recommendations for frequency of eye exams.  Eat a healthy diet. Foods like vegetables, fruits, whole grains, low-fat dairy products, and lean protein foods contain the nutrients you need without too many calories. Decrease your intake of foods high in solid fats, added sugars, and salt. Eat the right amount of calories for you.Get information about a proper diet from your health care provider, if necessary.  Regular physical exercise is one of the most important things you can do for your health. Most adults should get at least 150 minutes of moderate-intensity exercise (any activity that increases your heart rate and causes you to sweat) each week. In addition, most adults need muscle-strengthening exercises on 2 or more days a week.  Maintain a healthy weight. The body mass index (BMI) is a screening tool to identify possible weight problems. It provides an estimate of body fat based on height and weight. Your health care provider can find your BMI and can help you achieve or maintain a healthy weight.For adults 20 years and older:  A BMI below 18.5 is considered underweight.  A BMI of 18.5 to 24.9 is normal.  A BMI of 25 to 29.9 is considered overweight.  A  BMI of 30 and above is considered obese.  Maintain normal blood lipids and cholesterol levels by exercising and minimizing your intake of saturated fat. Eat a balanced diet with plenty of fruit and vegetables. Blood tests for lipids and cholesterol should begin at age 45 and be repeated every 5 years. If your lipid or cholesterol levels are high, you are over 50, or you are at high risk for heart disease, you may need your cholesterol levels checked more frequently.Ongoing high lipid and cholesterol levels should be treated with medicines if diet and exercise are not working.  If you smoke, find out from your health care provider how to quit. If you do not use tobacco, do not start.  Lung cancer screening is recommended for adults aged 45-80 years who are at high risk for developing lung cancer because of a history of smoking. A yearly low-dose CT scan of the lungs is recommended for people who have at least a 30-pack-year history of smoking and are a current smoker or have quit within the past 15 years. A pack year of smoking is smoking an average of 1 pack of cigarettes a day for 1 year (for example: 1 pack a day for 30 years or 2 packs a day for 15 years). Yearly screening should continue until the smoker has stopped smoking for at least 15 years. Yearly screening should be stopped for people who develop a health problem that would prevent them from having lung cancer treatment.  If you are pregnant, do not drink alcohol. If you are  breastfeeding, be very cautious about drinking alcohol. If you are not pregnant and choose to drink alcohol, do not have more than 1 drink per day. One drink is considered to be 12 ounces (355 mL) of beer, 5 ounces (148 mL) of wine, or 1.5 ounces (44 mL) of liquor.  Avoid use of street drugs. Do not share needles with anyone. Ask for help if you need support or instructions about stopping the use of drugs.  High blood pressure causes heart disease and increases the risk  of stroke. Your blood pressure should be checked at least every 1 to 2 years. Ongoing high blood pressure should be treated with medicines if weight loss and exercise do not work.  If you are 55-79 years old, ask your health care provider if you should take aspirin to prevent strokes.  Diabetes screening is done by taking a blood sample to check your blood glucose level after you have not eaten for a certain period of time (fasting). If you are not overweight and you do not have risk factors for diabetes, you should be screened once every 3 years starting at age 45. If you are overweight or obese and you are 40-70 years of age, you should be screened for diabetes every year as part of your cardiovascular risk assessment.  Breast cancer screening is essential preventive care for women. You should practice "breast self-awareness." This means understanding the normal appearance and feel of your breasts and may include breast self-examination. Any changes detected, no matter how small, should be reported to a health care provider. Women in their 20s and 30s should have a clinical breast exam (CBE) by a health care provider as part of a regular health exam every 1 to 3 years. After age 40, women should have a CBE every year. Starting at age 40, women should consider having a mammogram (breast X-ray test) every year. Women who have a family history of breast cancer should talk to their health care provider about genetic screening. Women at a high risk of breast cancer should talk to their health care providers about having an MRI and a mammogram every year.  Breast cancer gene (BRCA)-related cancer risk assessment is recommended for women who have family members with BRCA-related cancers. BRCA-related cancers include breast, ovarian, tubal, and peritoneal cancers. Having family members with these cancers may be associated with an increased risk for harmful changes (mutations) in the breast cancer genes BRCA1 and  BRCA2. Results of the assessment will determine the need for genetic counseling and BRCA1 and BRCA2 testing.  Your health care provider may recommend that you be screened regularly for cancer of the pelvic organs (ovaries, uterus, and vagina). This screening involves a pelvic examination, including checking for microscopic changes to the surface of your cervix (Pap test). You may be encouraged to have this screening done every 3 years, beginning at age 21.  For women ages 30-65, health care providers may recommend pelvic exams and Pap testing every 3 years, or they may recommend the Pap and pelvic exam, combined with testing for human papilloma virus (HPV), every 5 years. Some types of HPV increase your risk of cervical cancer. Testing for HPV may also be done on women of any age with unclear Pap test results.  Other health care providers may not recommend any screening for nonpregnant women who are considered low risk for pelvic cancer and who do not have symptoms. Ask your health care provider if a screening pelvic exam is right for   you.  If you have had past treatment for cervical cancer or a condition that could lead to cancer, you need Pap tests and screening for cancer for at least 20 years after your treatment. If Pap tests have been discontinued, your risk factors (such as having a new sexual partner) need to be reassessed to determine if screening should resume. Some women have medical problems that increase the chance of getting cervical cancer. In these cases, your health care provider may recommend more frequent screening and Pap tests.  Colorectal cancer can be detected and often prevented. Most routine colorectal cancer screening begins at the age of 50 years and continues through age 75 years. However, your health care provider may recommend screening at an earlier age if you have risk factors for colon cancer. On a yearly basis, your health care provider may provide home test kits to check  for hidden blood in the stool. Use of a small camera at the end of a tube, to directly examine the colon (sigmoidoscopy or colonoscopy), can detect the earliest forms of colorectal cancer. Talk to your health care provider about this at age 50, when routine screening begins. Direct exam of the colon should be repeated every 5-10 years through age 75 years, unless early forms of precancerous polyps or small growths are found.  People who are at an increased risk for hepatitis B should be screened for this virus. You are considered at high risk for hepatitis B if:  You were born in a country where hepatitis B occurs often. Talk with your health care provider about which countries are considered high risk.  Your parents were born in a high-risk country and you have not received a shot to protect against hepatitis B (hepatitis B vaccine).  You have HIV or AIDS.  You use needles to inject street drugs.  You live with, or have sex with, someone who has hepatitis B.  You get hemodialysis treatment.  You take certain medicines for conditions like cancer, organ transplantation, and autoimmune conditions.  Hepatitis C blood testing is recommended for all people born from 1945 through 1965 and any individual with known risks for hepatitis C.  Practice safe sex. Use condoms and avoid high-risk sexual practices to reduce the spread of sexually transmitted infections (STIs). STIs include gonorrhea, chlamydia, syphilis, trichomonas, herpes, HPV, and human immunodeficiency virus (HIV). Herpes, HIV, and HPV are viral illnesses that have no cure. They can result in disability, cancer, and death.  You should be screened for sexually transmitted illnesses (STIs) including gonorrhea and chlamydia if:  You are sexually active and are younger than 24 years.  You are older than 24 years and your health care provider tells you that you are at risk for this type of infection.  Your sexual activity has changed  since you were last screened and you are at an increased risk for chlamydia or gonorrhea. Ask your health care provider if you are at risk.  If you are at risk of being infected with HIV, it is recommended that you take a prescription medicine daily to prevent HIV infection. This is called preexposure prophylaxis (PrEP). You are considered at risk if:  You are sexually active and do not regularly use condoms or know the HIV status of your partner(s).  You take drugs by injection.  You are sexually active with a partner who has HIV.  Talk with your health care provider about whether you are at high risk of being infected with HIV. If   you choose to begin PrEP, you should first be tested for HIV. You should then be tested every 3 months for as long as you are taking PrEP.  Osteoporosis is a disease in which the bones lose minerals and strength with aging. This can result in serious bone fractures or breaks. The risk of osteoporosis can be identified using a bone density scan. Women ages 67 years and over and women at risk for fractures or osteoporosis should discuss screening with their health care providers. Ask your health care provider whether you should take a calcium supplement or vitamin D to reduce the rate of osteoporosis.  Menopause can be associated with physical symptoms and risks. Hormone replacement therapy is available to decrease symptoms and risks. You should talk to your health care provider about whether hormone replacement therapy is right for you.  Use sunscreen. Apply sunscreen liberally and repeatedly throughout the day. You should seek shade when your shadow is shorter than you. Protect yourself by wearing long sleeves, pants, a wide-brimmed hat, and sunglasses year round, whenever you are outdoors.  Once a month, do a whole body skin exam, using a mirror to look at the skin on your back. Tell your health care provider of new moles, moles that have irregular borders, moles that  are larger than a pencil eraser, or moles that have changed in shape or color.  Stay current with required vaccines (immunizations).  Influenza vaccine. All adults should be immunized every year.  Tetanus, diphtheria, and acellular pertussis (Td, Tdap) vaccine. Pregnant women should receive 1 dose of Tdap vaccine during each pregnancy. The dose should be obtained regardless of the length of time since the last dose. Immunization is preferred during the 27th-36th week of gestation. An adult who has not previously received Tdap or who does not know her vaccine status should receive 1 dose of Tdap. This initial dose should be followed by tetanus and diphtheria toxoids (Td) booster doses every 10 years. Adults with an unknown or incomplete history of completing a 3-dose immunization series with Td-containing vaccines should begin or complete a primary immunization series including a Tdap dose. Adults should receive a Td booster every 10 years.  Varicella vaccine. An adult without evidence of immunity to varicella should receive 2 doses or a second dose if she has previously received 1 dose. Pregnant females who do not have evidence of immunity should receive the first dose after pregnancy. This first dose should be obtained before leaving the health care facility. The second dose should be obtained 4-8 weeks after the first dose.  Human papillomavirus (HPV) vaccine. Females aged 13-26 years who have not received the vaccine previously should obtain the 3-dose series. The vaccine is not recommended for use in pregnant females. However, pregnancy testing is not needed before receiving a dose. If a female is found to be pregnant after receiving a dose, no treatment is needed. In that case, the remaining doses should be delayed until after the pregnancy. Immunization is recommended for any person with an immunocompromised condition through the age of 61 years if she did not get any or all doses earlier. During the  3-dose series, the second dose should be obtained 4-8 weeks after the first dose. The third dose should be obtained 24 weeks after the first dose and 16 weeks after the second dose.  Zoster vaccine. One dose is recommended for adults aged 30 years or older unless certain conditions are present.  Measles, mumps, and rubella (MMR) vaccine. Adults born  before 1957 generally are considered immune to measles and mumps. Adults born in 1957 or later should have 1 or more doses of MMR vaccine unless there is a contraindication to the vaccine or there is laboratory evidence of immunity to each of the three diseases. A routine second dose of MMR vaccine should be obtained at least 28 days after the first dose for students attending postsecondary schools, health care workers, or international travelers. People who received inactivated measles vaccine or an unknown type of measles vaccine during 1963-1967 should receive 2 doses of MMR vaccine. People who received inactivated mumps vaccine or an unknown type of mumps vaccine before 1979 and are at high risk for mumps infection should consider immunization with 2 doses of MMR vaccine. For females of childbearing age, rubella immunity should be determined. If there is no evidence of immunity, females who are not pregnant should be vaccinated. If there is no evidence of immunity, females who are pregnant should delay immunization until after pregnancy. Unvaccinated health care workers born before 1957 who lack laboratory evidence of measles, mumps, or rubella immunity or laboratory confirmation of disease should consider measles and mumps immunization with 2 doses of MMR vaccine or rubella immunization with 1 dose of MMR vaccine.  Pneumococcal 13-valent conjugate (PCV13) vaccine. When indicated, a person who is uncertain of his immunization history and has no record of immunization should receive the PCV13 vaccine. All adults 65 years of age and older should receive this  vaccine. An adult aged 19 years or older who has certain medical conditions and has not been previously immunized should receive 1 dose of PCV13 vaccine. This PCV13 should be followed with a dose of pneumococcal polysaccharide (PPSV23) vaccine. Adults who are at high risk for pneumococcal disease should obtain the PPSV23 vaccine at least 8 weeks after the dose of PCV13 vaccine. Adults older than 49 years of age who have normal immune system function should obtain the PPSV23 vaccine dose at least 1 year after the dose of PCV13 vaccine.  Pneumococcal polysaccharide (PPSV23) vaccine. When PCV13 is also indicated, PCV13 should be obtained first. All adults aged 65 years and older should be immunized. An adult younger than age 65 years who has certain medical conditions should be immunized. Any person who resides in a nursing home or long-term care facility should be immunized. An adult smoker should be immunized. People with an immunocompromised condition and certain other conditions should receive both PCV13 and PPSV23 vaccines. People with human immunodeficiency virus (HIV) infection should be immunized as soon as possible after diagnosis. Immunization during chemotherapy or radiation therapy should be avoided. Routine use of PPSV23 vaccine is not recommended for American Indians, [place] Natives, or people younger than 65 years unless there are medical conditions that require PPSV23 vaccine. When indicated, people who have unknown immunization and have no record of immunization should receive PPSV23 vaccine. One-time revaccination 5 years after the first dose of PPSV23 is recommended for people aged 19-64 years who have chronic kidney failure, nephrotic syndrome, asplenia, or immunocompromised conditions. People who received 1-2 doses of PPSV23 before age 65 years should receive another dose of PPSV23 vaccine at age 65 years or later if at least 5 years have passed since the previous dose. Doses of PPSV23 are not  needed for people immunized with PPSV23 at or after age 65 years.  Meningococcal vaccine. Adults with asplenia or persistent complement component deficiencies should receive 2 doses of quadrivalent meningococcal conjugate (MenACWY-D) vaccine. The doses should be obtained   at least 2 months apart. Microbiologists working with certain meningococcal bacteria, [place] recruits, people at risk during an outbreak, and people who travel to or live in countries with a high rate of meningitis should be immunized. A first-year college student up through age 34 years who is living in a residence hall should receive a dose if she did not receive a dose on or after her 16th birthday. Adults who have certain high-risk conditions should receive one or more doses of vaccine.  Hepatitis A vaccine. Adults who wish to be protected from this disease, have certain high-risk conditions, work with hepatitis A-infected animals, work in hepatitis A research labs, or travel to or work in countries with a high rate of hepatitis A should be immunized. Adults who were previously unvaccinated and who anticipate close contact with an international adoptee during the first 60 days after arrival in the [place] from a country with a high rate of hepatitis A should be immunized.  Hepatitis B vaccine. Adults who wish to be protected from this disease, have certain high-risk conditions, may be exposed to blood or other infectious body fluids, are household contacts or sex partners of hepatitis B positive people, are clients or workers in certain care facilities, or travel to or work in countries with a high rate of hepatitis B should be immunized.  Haemophilus influenzae type b (Hib) vaccine. A previously unvaccinated person with asplenia or sickle cell disease or having a scheduled splenectomy should receive 1 dose of Hib vaccine. Regardless of previous immunization, a recipient of a hematopoietic stem cell transplant should receive a  3-dose series 6-12 months after her successful transplant. Hib vaccine is not recommended for adults with HIV infection. Preventive Services / Frequency Ages 35 to 4 years  Blood pressure check.** / Every 3-5 years.  Lipid and cholesterol check.** / Every 5 years beginning at age 60.  Clinical breast exam.** / Every 3 years for women in their 71s and 10s.  BRCA-related cancer risk assessment.** / For women who have family members with a BRCA-related cancer (breast, ovarian, tubal, or peritoneal cancers).  Pap test.** / Every 2 years from ages 76 through 26. Every 3 years starting at age 61 through age 76 or 93 with a history of 3 consecutive normal Pap tests.  HPV screening.** / Every 3 years from ages 37 through ages 60 to 51 with a history of 3 consecutive normal Pap tests.  Hepatitis C blood test.** / For any individual with known risks for hepatitis C.  Skin self-exam. / Monthly.  Influenza vaccine. / Every year.  Tetanus, diphtheria, and acellular pertussis (Tdap, Td) vaccine.** / Consult your health care provider. Pregnant women should receive 1 dose of Tdap vaccine during each pregnancy. 1 dose of Td every 10 years.  Varicella vaccine.** / Consult your health care provider. Pregnant females who do not have evidence of immunity should receive the first dose after pregnancy.  HPV vaccine. / 3 doses over 6 months, if 93 and younger. The vaccine is not recommended for use in pregnant females. However, pregnancy testing is not needed before receiving a dose.  Measles, mumps, rubella (MMR) vaccine.** / You need at least 1 dose of MMR if you were born in 1957 or later. You may also need a 2nd dose. For females of childbearing age, rubella immunity should be determined. If there is no evidence of immunity, females who are not pregnant should be vaccinated. If there is no evidence of immunity, females who are  pregnant should delay immunization until after pregnancy.  Pneumococcal  13-valent conjugate (PCV13) vaccine.** / Consult your health care provider.  Pneumococcal polysaccharide (PPSV23) vaccine.** / 1 to 2 doses if you smoke cigarettes or if you have certain conditions.  Meningococcal vaccine.** / 1 dose if you are age 68 to 8 years and a Market researcher living in a residence hall, or have one of several medical conditions, you need to get vaccinated against meningococcal disease. You may also need additional booster doses.  Hepatitis A vaccine.** / Consult your health care provider.  Hepatitis B vaccine.** / Consult your health care provider.  Haemophilus influenzae type b (Hib) vaccine.** / Consult your health care provider. Ages 7 to 53 years  Blood pressure check.** / Every year.  Lipid and cholesterol check.** / Every 5 years beginning at age 25 years.  Lung cancer screening. / Every year if you are aged 11-80 years and have a 30-pack-year history of smoking and currently smoke or have quit within the past 15 years. Yearly screening is stopped once you have quit smoking for at least 15 years or develop a health problem that would prevent you from having lung cancer treatment.  Clinical breast exam.** / Every year after age 48 years.  BRCA-related cancer risk assessment.** / For women who have family members with a BRCA-related cancer (breast, ovarian, tubal, or peritoneal cancers).  Mammogram.** / Every year beginning at age 41 years and continuing for as long as you are in good health. Consult with your health care provider.  Pap test.** / Every 3 years starting at age 65 years through age 37 or 70 years with a history of 3 consecutive normal Pap tests.  HPV screening.** / Every 3 years from ages 72 years through ages 60 to 40 years with a history of 3 consecutive normal Pap tests.  Fecal occult blood test (FOBT) of stool. / Every year beginning at age 21 years and continuing until age 5 years. You may not need to do this test if you get  a colonoscopy every 10 years.  Flexible sigmoidoscopy or colonoscopy.** / Every 5 years for a flexible sigmoidoscopy or every 10 years for a colonoscopy beginning at age 35 years and continuing until age 48 years.  Hepatitis C blood test.** / For all people born from [birth year] through 1965 and any individual with known risks for hepatitis C.  Skin self-exam. / Monthly.  Influenza vaccine. / Every year.  Tetanus, diphtheria, and acellular pertussis (Tdap/Td) vaccine.** / Consult your health care provider. Pregnant women should receive 1 dose of Tdap vaccine during each pregnancy. 1 dose of Td every 10 years.  Varicella vaccine.** / Consult your health care provider. Pregnant females who do not have evidence of immunity should receive the first dose after pregnancy.  Zoster vaccine.** / 1 dose for adults aged 30 years or older.  Measles, mumps, rubella (MMR) vaccine.** / You need at least 1 dose of MMR if you were born in 1957 or later. You may also need a second dose. For females of childbearing age, rubella immunity should be determined. If there is no evidence of immunity, females who are not pregnant should be vaccinated. If there is no evidence of immunity, females who are pregnant should delay immunization until after pregnancy.  Pneumococcal 13-valent conjugate (PCV13) vaccine.** / Consult your health care provider.  Pneumococcal polysaccharide (PPSV23) vaccine.** / 1 to 2 doses if you smoke cigarettes or if you have certain conditions.  Meningococcal vaccine.** /  Consult your health care provider.  Hepatitis A vaccine.** / Consult your health care provider.  Hepatitis B vaccine.** / Consult your health care provider.  Haemophilus influenzae type b (Hib) vaccine.** / Consult your health care provider. Ages 64 years and over  Blood pressure check.** / Every year.  Lipid and cholesterol check.** / Every 5 years beginning at age 23 years.  Lung cancer screening. / Every year if you  are aged 16-80 years and have a 30-pack-year history of smoking and currently smoke or have quit within the past 15 years. Yearly screening is stopped once you have quit smoking for at least 15 years or develop a health problem that would prevent you from having lung cancer treatment.  Clinical breast exam.** / Every year after age 74 years.  BRCA-related cancer risk assessment.** / For women who have family members with a BRCA-related cancer (breast, ovarian, tubal, or peritoneal cancers).  Mammogram.** / Every year beginning at age 44 years and continuing for as long as you are in good health. Consult with your health care provider.  Pap test.** / Every 3 years starting at age 58 years through age 22 or 39 years with 3 consecutive normal Pap tests. Testing can be stopped between 65 and 70 years with 3 consecutive normal Pap tests and no abnormal Pap or HPV tests in the past 10 years.  HPV screening.** / Every 3 years from ages 64 years through ages 70 or 61 years with a history of 3 consecutive normal Pap tests. Testing can be stopped between 65 and 70 years with 3 consecutive normal Pap tests and no abnormal Pap or HPV tests in the past 10 years.  Fecal occult blood test (FOBT) of stool. / Every year beginning at age 40 years and continuing until age 27 years. You may not need to do this test if you get a colonoscopy every 10 years.  Flexible sigmoidoscopy or colonoscopy.** / Every 5 years for a flexible sigmoidoscopy or every 10 years for a colonoscopy beginning at age 7 years and continuing until age 32 years.  Hepatitis C blood test.** / For all people born from [birth year] through 1965 and any individual with known risks for hepatitis C.  Osteoporosis screening.** / A one-time screening for women ages 30 years and over and women at risk for fractures or osteoporosis.  Skin self-exam. / Monthly.  Influenza vaccine. / Every year.  Tetanus, diphtheria, and acellular pertussis (Tdap/Td)  vaccine.** / 1 dose of Td every 10 years.  Varicella vaccine.** / Consult your health care provider.  Zoster vaccine.** / 1 dose for adults aged 35 years or older.  Pneumococcal 13-valent conjugate (PCV13) vaccine.** / Consult your health care provider.  Pneumococcal polysaccharide (PPSV23) vaccine.** / 1 dose for all adults aged 46 years and older.  Meningococcal vaccine.** / Consult your health care provider.  Hepatitis A vaccine.** / Consult your health care provider.  Hepatitis B vaccine.** / Consult your health care provider.  Haemophilus influenzae type b (Hib) vaccine.** / Consult your health care provider. ** Family history and personal history of risk and conditions may change your health care provider's recommendations.   This information is not intended to replace advice given to you by your health care provider. Make sure you discuss any questions you have with your health care provider.   Document Released: 03/06/2001 Document Revised: 01/29/2014 Document Reviewed: 06/05/2010 Elsevier Interactive Patient Education [employer].

## 2014-10-30 LAB — TSH: TSH: 1.308 u[IU]/mL (ref 0.350–4.500)

## 2014-11-01 ENCOUNTER — Telehealth: Payer: Self-pay | Admitting: *Deleted

## 2014-11-01 NOTE — Telephone Encounter (Signed)
-----   Message from Reva Bores, MD sent at 11/01/2014  8:00 AM EDT ----- Labs ok--needs diet for lowering cholesterol.

## 2014-11-01 NOTE — Telephone Encounter (Signed)
Informed pt of lab results and elevated total and LDL cholesterol.  Recommended a change in diet and exercise and to follow a low cholesterol diet.  Pt had concerns that her cholesterol has remained increased over the past few years and was wanting to know if she should start medication.  Instructed pt to contact PCP and see what there recommendation would be at this time.  Pt acknowledged.

## 2014-12-06 ENCOUNTER — Encounter: Payer: Self-pay | Admitting: *Deleted

## 2014-12-06 ENCOUNTER — Telehealth: Payer: Self-pay | Admitting: *Deleted

## 2014-12-06 NOTE — Telephone Encounter (Signed)
Pt seen in office on 11-01-14 for routine gynecological exam, had blood work for CBC,lipid, TSH.  Pt now requesting HIV and Hep C testing, currently in [place].  [facility] order to [facility] for additional test.

## 2014-12-07 ENCOUNTER — Other Ambulatory Visit: Payer: Self-pay | Admitting: Family Medicine

## 2014-12-10 LAB — HEPATITIS C ANTIBODY: HCV AB: NEGATIVE

## 2014-12-10 LAB — HIV ANTIBODY (ROUTINE TESTING W REFLEX): HIV 1&2 Ab, 4th Generation: NONREACTIVE

## 2014-12-14 ENCOUNTER — Telehealth: Payer: Self-pay | Admitting: *Deleted

## 2014-12-14 NOTE — Telephone Encounter (Signed)
Informed pt of negative Hep C and HIV lab result.

## 2015-05-26 ENCOUNTER — Other Ambulatory Visit (HOSPITAL_COMMUNITY): Payer: Self-pay | Admitting: Obstetrics & Gynecology

## 2015-05-26 DIAGNOSIS — E8940 Asymptomatic postprocedural ovarian failure: Secondary | ICD-10-CM

## 2015-05-26 DIAGNOSIS — Z79890 Hormone replacement therapy (postmenopausal): Principal | ICD-10-CM

## 2015-05-30 ENCOUNTER — Other Ambulatory Visit: Payer: Self-pay | Admitting: *Deleted

## 2015-05-30 NOTE — Telephone Encounter (Signed)
Pt had annual exam in October 2016, sent refills to pharmacy per Dr Shawnie PonsPratt order.

## 2015-08-03 ENCOUNTER — Telehealth: Payer: Self-pay | Admitting: *Deleted

## 2015-08-03 NOTE — Telephone Encounter (Signed)
Pt called in stating she was switched to the estradiol patch from the Vivelle Dot patch due to cost. She started having LLE, itching, tingling, which stopped once she removed the patch. She would like to know if there is another option. I advised pt that I will speak with provider covering our office and call her back.

## 2015-08-04 ENCOUNTER — Telehealth: Payer: Self-pay | Admitting: *Deleted

## 2015-08-04 NOTE — Telephone Encounter (Signed)
Spoke to pt about reaction she was experiencing with the generic estradiol patch.  States she has removed it and since then the swelling and itching in her left leg has resolved.  States she is gonna switch back to the Vivelle dot brand again since she had no side effects with that brand.

## 2015-11-15 ENCOUNTER — Other Ambulatory Visit: Payer: Self-pay | Admitting: Family Medicine

## 2015-11-15 DIAGNOSIS — A609 Anogenital herpesviral infection, unspecified: Secondary | ICD-10-CM

## 2016-02-06 ENCOUNTER — Ambulatory Visit: Payer: Federal, State, Local not specified - PPO | Admitting: Obstetrics and Gynecology

## 2016-02-09 ENCOUNTER — Other Ambulatory Visit: Payer: Self-pay | Admitting: Family Medicine

## 2016-02-09 DIAGNOSIS — A609 Anogenital herpesviral infection, unspecified: Secondary | ICD-10-CM

## 2018-01-16 ENCOUNTER — Ambulatory Visit: Payer: Federal, State, Local not specified - PPO | Admitting: Obstetrics & Gynecology
